# Patient Record
Sex: Male | Born: 1970 | Race: Black or African American | Hispanic: No | Marital: Married
Health system: Southern US, Community
[De-identification: ages and names within clinical notes are randomized; demographics above are authoritative.]

---

## 2020-12-12 ENCOUNTER — Encounter: Payer: Self-pay | Admitting: Emergency Medicine

## 2020-12-12 ENCOUNTER — Emergency Department
Admission: EM | Admit: 2020-12-12 | Discharge: 2020-12-12 | Disposition: A | Discharge status: Home / Self Care | Payer: No Typology Code available for payment source | Attending: Emergency Medicine | Admitting: Emergency Medicine

## 2020-12-12 ENCOUNTER — Emergency Department: Payer: No Typology Code available for payment source

## 2020-12-12 ENCOUNTER — Other Ambulatory Visit: Payer: Self-pay

## 2020-12-12 DIAGNOSIS — Y990 Civilian activity done for income or pay: Secondary | ICD-10-CM | POA: Insufficient documentation

## 2020-12-12 DIAGNOSIS — Z23 Encounter for immunization: Secondary | ICD-10-CM | POA: Insufficient documentation

## 2020-12-12 DIAGNOSIS — W208XXA Other cause of strike by thrown, projected or falling object, initial encounter: Secondary | ICD-10-CM | POA: Diagnosis not present

## 2020-12-12 DIAGNOSIS — S6710XA Crushing injury of unspecified finger(s), initial encounter: Secondary | ICD-10-CM

## 2020-12-12 DIAGNOSIS — S6992XA Unspecified injury of left wrist, hand and finger(s), initial encounter: Secondary | ICD-10-CM | POA: Diagnosis present

## 2020-12-12 DIAGNOSIS — S62631B Displaced fracture of distal phalanx of left index finger, initial encounter for open fracture: Secondary | ICD-10-CM | POA: Insufficient documentation

## 2020-12-12 DIAGNOSIS — S62639B Displaced fracture of distal phalanx of unspecified finger, initial encounter for open fracture: Secondary | ICD-10-CM

## 2020-12-12 MED ORDER — HYDROCODONE-ACETAMINOPHEN 5-325 MG PO TABS: 1.0000 | ORAL_TABLET | Freq: Four times a day (QID) | ORAL | 0 refills | Status: AC | PRN

## 2020-12-12 MED ORDER — BUPIVACAINE HCL 0.25 % IJ SOLN
10.0000 mL | Freq: Once | INTRAMUSCULAR | Status: AC
  Administered 2020-12-12: 10 mL
  Filled 2020-12-12: qty 10

## 2020-12-12 MED ORDER — TETANUS-DIPHTH-ACELL PERTUSSIS 5-2.5-18.5 LF-MCG/0.5 IM SUSY
0.5000 mL | PREFILLED_SYRINGE | Freq: Once | INTRAMUSCULAR | Status: AC
  Administered 2020-12-12: 0.5 mL via INTRAMUSCULAR
  Filled 2020-12-12: qty 0.5

## 2020-12-12 MED ORDER — CEPHALEXIN 500 MG PO CAPS: 500.0000 mg | ORAL_CAPSULE | Freq: Four times a day (QID) | ORAL | 0 refills | Status: AC

## 2020-12-12 MED ORDER — CEFAZOLIN SODIUM-DEXTROSE 1-4 GM/50ML-% IV SOLN
1.0000 g | Freq: Once | INTRAVENOUS | Status: AC
  Administered 2020-12-12: 1 g via INTRAVENOUS
  Filled 2020-12-12: qty 50

## 2020-12-12 MED ORDER — OXYCODONE-ACETAMINOPHEN 5-325 MG PO TABS
1.0000 | ORAL_TABLET | Freq: Once | ORAL | Status: AC
  Administered 2020-12-12: 1 via ORAL
  Filled 2020-12-12: qty 1

## 2020-12-12 NOTE — ED Triage Notes (Signed)
Work related incident, pt states a man hole cover fell on the tip of the third digit on his left hand this am and cut the second digit on left hand as well. NAD. WC case.

## 2020-12-12 NOTE — ED Notes (Signed)
Non-adherent dressing applied to wound. Patient instructed on care and dressing changes.

## 2020-12-12 NOTE — Discharge Instructions (Addendum)
Follow-up with [facility] clinic orthopedics.  Please call for an appointment.  If you are filing Worker's Comp. please make sure that your Worker's Comp. insurance will pay for this visit.  If not I should refer you to a hand specialist of their choice.  Do not change the dressing until seen by orthopedics.  Keep the hand elevated to help decrease pain.  Take pain medication and antibiotics as prescribed.  Return to the emergency department for worsening

## 2020-12-12 NOTE — ED Notes (Signed)
See triage note - pt presents with injury to left middle finger, tip of left middle finger missing upon assessment. Sensation intact.

## 2020-12-12 NOTE — ED Provider Notes (Signed)
[facility] Emergency Department Provider Note  ____________________________________________   Event Date/Time   First MD Initiated Contact with Patient 12/12/20 1147     (approximate)  I have reviewed the triage vital signs and the nursing notes.   HISTORY  Chief Complaint Finger Injury    HPI Jake Murphy is a 50 y.o. male presents emergency department with a left hand injury.  Patient was at work today when a [employer] cover dropped on his fingers.  States it took off part of his finger abrasion on the other.  Both are painful.  Unsure of last Tdap.  No other injuries reported  History reviewed. No pertinent past medical history.  There are no problems to display for this patient.   History reviewed. No pertinent surgical history.  Prior to Admission medications   Not on File    Allergies Patient has no known allergies.  No family history on file.  Social History    Review of Systems  Constitutional: No fever/chills Eyes: No visual changes. ENT: No sore throat. Respiratory: Denies cough Genitourinary: Negative for dysuria. Musculoskeletal: Negative for back pain.  Positive for left hand pain Skin: Negative for rash. Psychiatric: no mood changes,     ____________________________________________   PHYSICAL EXAM:  VITAL SIGNS: ED Triage Vitals  Enc Vitals Group     BP 12/12/20 1111 115/67     Pulse Rate 12/12/20 1111 73     Resp 12/12/20 1111 15     Temp 12/12/20 1111 99 F (37.2 C)     Temp Source 12/12/20 1111 Oral     SpO2 12/12/20 1111 97 %     Weight 12/12/20 1107 220 lb (99.8 kg)     Height 12/12/20 1107 5\' 11"  (1.803 m)     Head Circumference --      Peak Flow --      Pain Score 12/12/20 1107 7     Pain Loc --      Pain Edu? --      Excl. in GC? --     Constitutional: Alert and oriented. Well appearing and in no acute distress. Eyes: Conjunctivae are normal.  Head: Atraumatic. Nose: No  congestion/rhinnorhea. Mouth/Throat: Mucous membranes are moist.   Neck:  supple no lymphadenopathy noted Cardiovascular: Normal rate, regular rhythm.  Respiratory: Normal respiratory effort.  No retractions, GU: deferred Musculoskeletal: FROM all extremities, warm and well perfused, positive for avulsion at the tip of the left third finger.  The bone does not appear to be exposed. Neurologic:  Normal speech and language.  Skin:  Skin is warm, dry No rash noted. Psychiatric: Mood and affect are normal. Speech and behavior are normal.  ____________________________________________   LABS (all labs ordered are listed, but only abnormal results are displayed)  Labs Reviewed - No data to display ____________________________________________   ____________________________________________  RADIOLOGY  X-ray of the left hand  ____________________________________________   PROCEDURES  Procedure(s) performed:   .Nerve Block  Date/Time: 12/12/2020 1:32 PM Performed by: 12/14/2020, PA-C Authorized by: Faythe Ghee, PA-C   Consent:    Consent obtained:  Verbal   Consent given by:  Patient   Risks, benefits, and alternatives were discussed: yes     Risks discussed:  Allergic reaction, infection, nerve damage, swelling, unsuccessful block, pain, intravenous injection and bleeding   Alternatives discussed:  No treatment Universal protocol:    Procedure explained and questions answered to patient or proxy's satisfaction: yes     Immediately  prior to procedure, a time out was called: yes     Patient identity confirmed:  Verbally with patient Indications:    Indications:  Pain relief Location:    Body area:  Upper extremity   Laterality:  Left Pre-procedure details:    Skin preparation:  Alcohol   Preparation: Patient was prepped and draped in usual sterile fashion   Skin anesthesia:    Skin anesthesia method:  Local infiltration   Local anesthetic:  Bupivacaine 0.5% w/o  epi Procedure details:    Block needle gauge:  22 G   Anesthetic injected:  Bupivacaine 0.5% w/o epi   Steroid injected:  None   Additive injected:  None   Injection procedure:  Anatomic landmarks identified, incremental injection, negative aspiration for blood, anatomic landmarks palpated and introduced needle   Paresthesia:  None Post-procedure details:    Outcome:  Anesthesia achieved   Procedure completion:  Tolerated well, no immediate complications    ____________________________________________   INITIAL IMPRESSION / ASSESSMENT AND PLAN / ED COURSE  Pertinent labs & imaging results that were available during my care of the patient were reviewed by me and considered in my medical decision making (see chart for details).   Patient is a 50 year old male presents with a Worker's Comp. injury for her left hand.  Has an avulsion to the left third finger.  See HPI.  Physical exam shows patient appears stable.  Exam is consistent with crush injury/avulsion  X-ray of the left hand Tdap updated Pain medication given  X-ray of the left hand reviewed by me confirmed by radiology to have a tuft avulsion, due to the skin being avulsed at this site  is considered open fracture  Digital block performed, see procedure note  Left 3rd finger was soaked in saline and betadine  Consult to orthopedics, Dr Allena Katz states to f/u in office, give IV antibiotics, oral antibiotics, soft dressing, and pain medication at discharge  All information was conveyed to the patient.  Patient was given Ancef 1 g IV  Prescription for Keflex and Vicodin Patient is to leave the dressing on until evaluated by orthopedics  Jake Murphy was evaluated in Emergency Department on 12/12/2020 for the symptoms described in the history of present illness. He was evaluated in the context of the global COVID-19 pandemic, which necessitated consideration that the patient might be at risk for infection with the SARS-CoV-2 virus  that causes COVID-19. Institutional protocols and algorithms that pertain to the evaluation of patients at risk for COVID-19 are in a state of rapid change based on information released by regulatory bodies including the CDC and federal and state organizations. These policies and algorithms were followed during the patient's care in the ED.    As part of my medical decision making, I reviewed the following data within the electronic MEDICAL RECORD NUMBER Nursing notes reviewed and incorporated, Old chart reviewed, Radiograph reviewed , A consult was requested and obtained from this/these consultant(s) Orthopedics, Notes from prior ED visits, and [place] Controlled Substance Database  ____________________________________________   FINAL CLINICAL IMPRESSION(S) / ED DIAGNOSES  Final diagnoses:  Open fracture of tuft of distal phalanx of finger  Crushing injury of finger of left hand      NEW MEDICATIONS STARTED DURING THIS VISIT:  New Prescriptions   No medications on file     Note:  This document was prepared using Dragon voice recognition software and may include unintentional dictation errors.    Faythe Ghee, PA-C 12/12/20 1341  Jene Every, MD 12/12/20 1357

## 2022-02-11 IMAGING — DX DG HAND COMPLETE 3+V*L*
3 series · 3 of 3 positions shown · non-contrast
Comparison: None.

CLINICAL DATA: Injury avulsion.  Crushed by man hole cover.

EXAM:
LEFT HAND - COMPLETE 3+ VIEW

[hand ap]
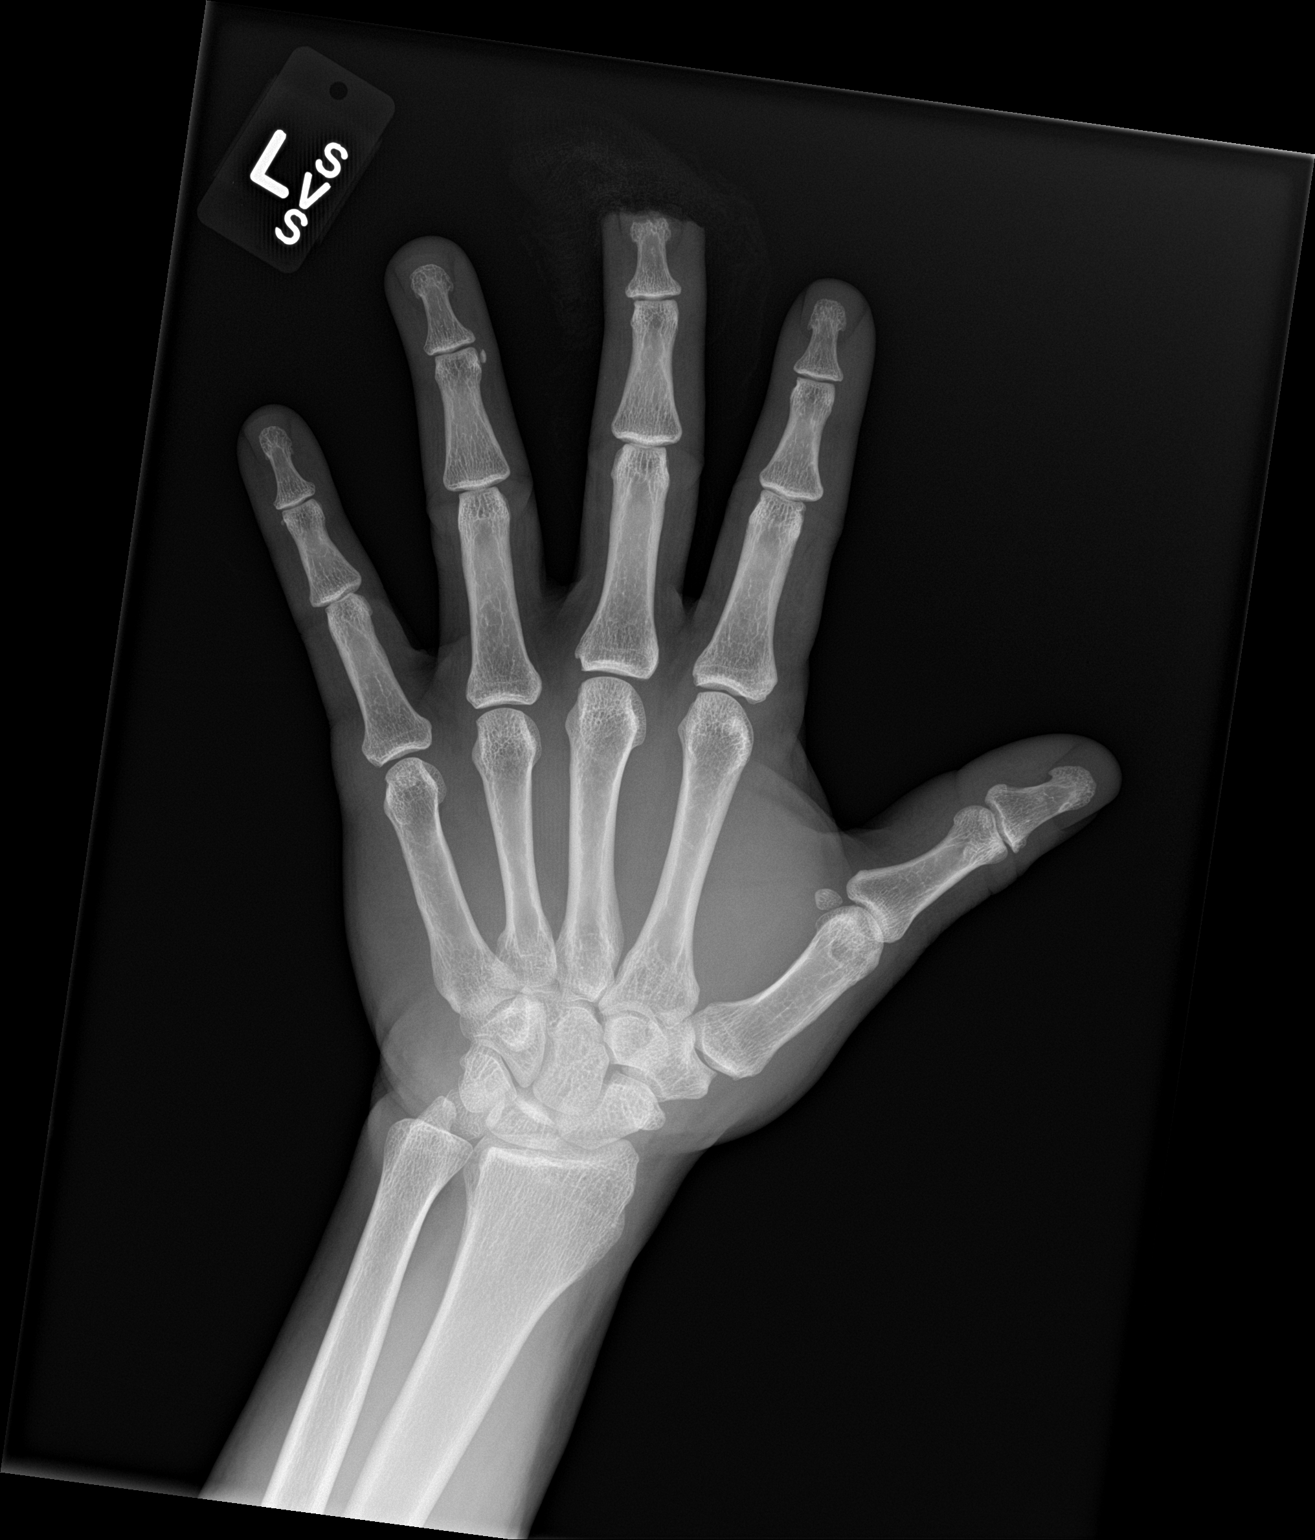

[hand obl]
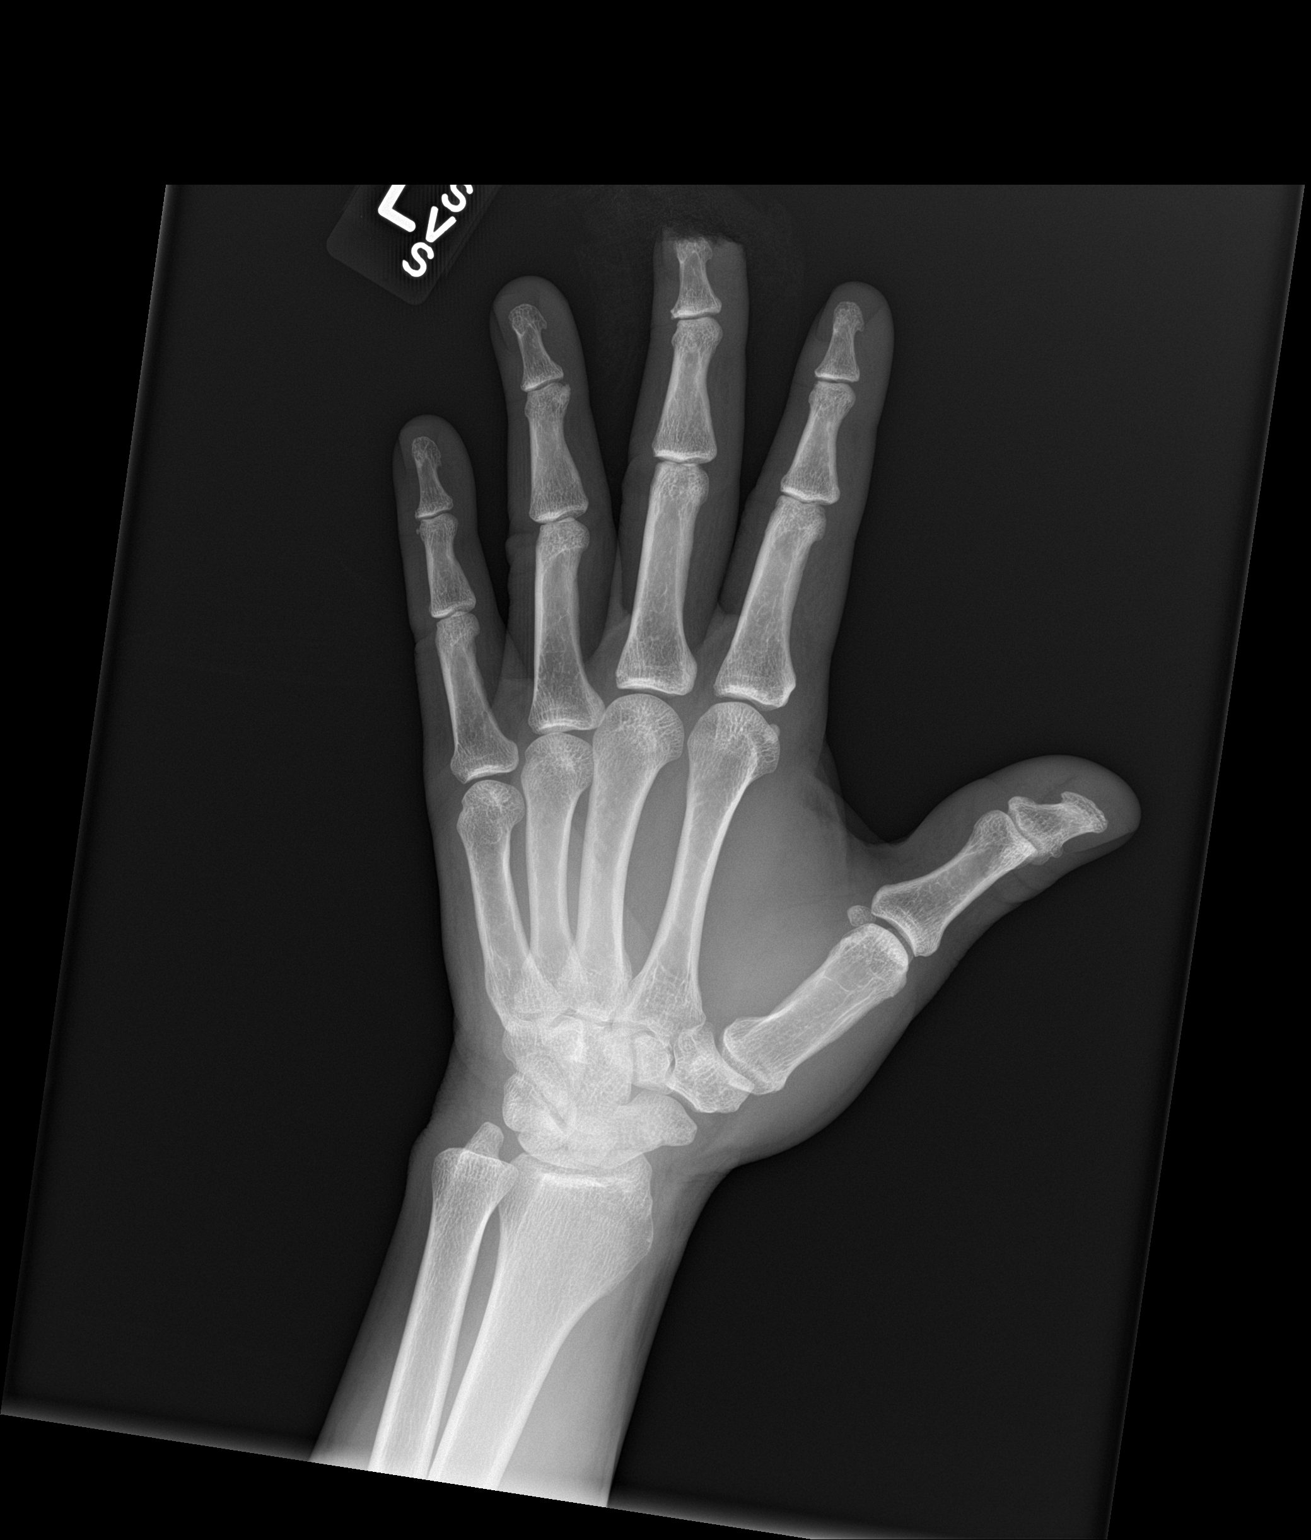

[hand lat]
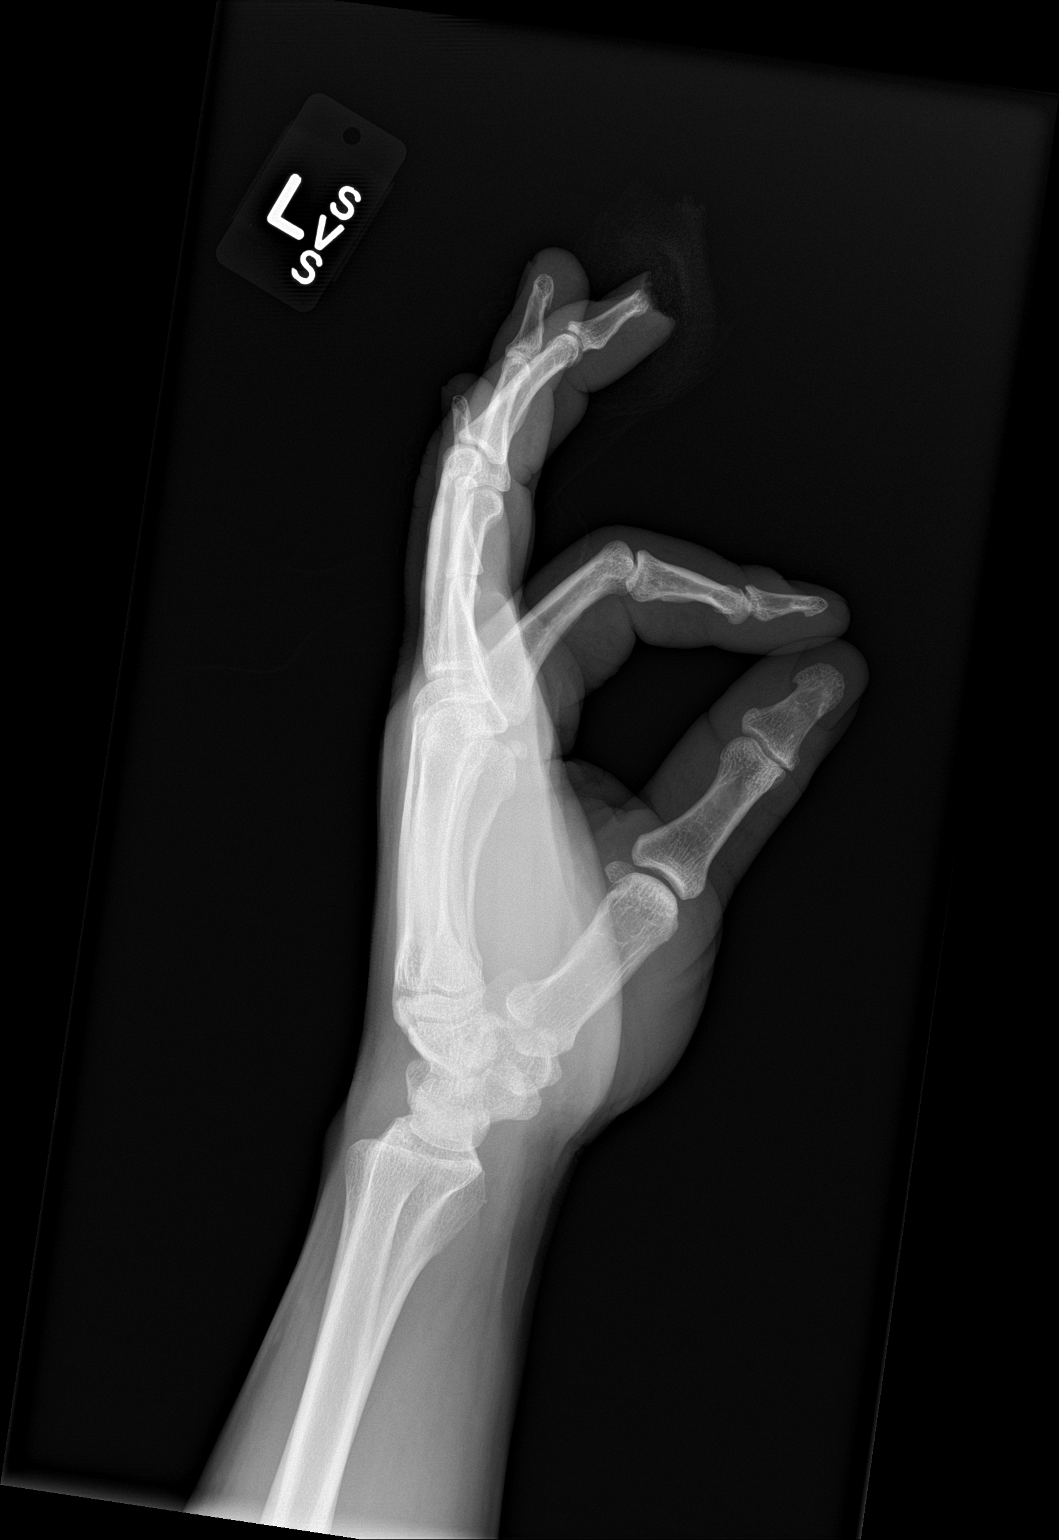

[3 of 3 positions shown; findings below may reference images not displayed]

FINDINGS: There is amputation of the tip of the long finger. The distal end of
the distal tuft is missing. Nail bed appears to remain in place. No
fracture line extending more proximal. No acute injury at any of the
other fingers visible by radiography. Chronic calcification adjacent
to the distal aspect of the middle phalanx of the ring finger.
IMPRESSION: Amputation of the tip of the long finger including the end of the
distal tuft.
